# Patient Record
Sex: Female | Born: 2005 | Race: Black or African American | Hispanic: No | Marital: Single | Smoking: Never smoker
Health system: Southern US, Community
[De-identification: ages and names within clinical notes are randomized; demographics above are authoritative.]

## PROBLEM LIST (undated history)

## (undated) DIAGNOSIS — J45909 Unspecified asthma, uncomplicated: Secondary | ICD-10-CM

---
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
## 2024-01-04 LAB — CERVICOVAGINAL ANCILLARY ONLY
Bacterial Vaginitis (gardnerella): NEGATIVE
Candida Glabrata: NEGATIVE
Candida Vaginitis: NEGATIVE
Chlamydia: NEGATIVE
Comment: NEGATIVE
Comment: NEGATIVE
Comment: NEGATIVE
Comment: NEGATIVE
Comment: NEGATIVE
Comment: NORMAL
Neisseria Gonorrhea: NEGATIVE
Trichomonas: NEGATIVE

## 2024-01-11 ENCOUNTER — Other Ambulatory Visit: Payer: Self-pay | Admitting: Obstetrics & Gynecology

## 2024-03-17 ENCOUNTER — Encounter: Payer: Self-pay | Admitting: Obstetrics & Gynecology

## 2024-03-17 ENCOUNTER — Other Ambulatory Visit (HOSPITAL_COMMUNITY)
Admission: RE | Admit: 2024-03-17 | Discharge: 2024-03-17 | Disposition: A | Discharge status: Home / Self Care | Source: Ambulatory Visit | Attending: Obstetrics & Gynecology | Admitting: Obstetrics & Gynecology

## 2024-03-17 ENCOUNTER — Ambulatory Visit (INDEPENDENT_AMBULATORY_CARE_PROVIDER_SITE_OTHER): Admitting: Obstetrics & Gynecology

## 2024-03-17 VITALS — BP 100/67 | HR 77 | Wt 120.0 lb

## 2024-03-17 DIAGNOSIS — N920 Excessive and frequent menstruation with regular cycle: Secondary | ICD-10-CM | POA: Insufficient documentation

## 2024-03-17 DIAGNOSIS — N760 Acute vaginitis: Secondary | ICD-10-CM

## 2024-03-17 DIAGNOSIS — Z113 Encounter for screening for infections with a predominantly sexual mode of transmission: Secondary | ICD-10-CM | POA: Diagnosis not present

## 2024-03-17 DIAGNOSIS — Z30013 Encounter for initial prescription of injectable contraceptive: Secondary | ICD-10-CM | POA: Diagnosis not present

## 2024-03-17 MED ORDER — MEDROXYPROGESTERONE ACETATE 150 MG/ML IM SUSP: 150.0000 mg | INTRAMUSCULAR | 3 refills | Status: DC

## 2024-03-17 MED ORDER — MEDROXYPROGESTERONE ACETATE 150 MG/ML IM SUSY
150.0000 mg | PREFILLED_SYRINGE | Freq: Once | INTRAMUSCULAR | Status: AC
  Administered 2024-03-17: 150 mg via INTRAMUSCULAR

## 2024-03-17 NOTE — Progress Notes (Signed)
 GYNECOLOGY OFFICE VISIT NOTE  History:  Danielle Weber is a 18 y.o. G0P0000 here today for evaluation and management of heavy menstrual periods.  Here with her mother.  Reports menarche at 33, always had heavy periods, where she saturates a pad an hour and associated with cramping.  Was on pills in the past, but forgot to take them, then switched to Nexplanon which caused prolonged bleeding for over three months. Now on Xulane, this helps her bleeding but it falls off a lot and causes irregular bleeding.  Wants to discuss alternative.  Of note, patient had Chlamydia in 11/2023, also wants STI testing.  Reports having recurrent BV, wants evaluation for this.   She denies any current abnormal vaginal bleeding, pelvic pain or other concerns.  Past Medical History:  Diagnosis Date   Asthma    Chlamydia 11/21/2023    History reviewed. No pertinent surgical history.  The following portions of the patient's history were reviewed and updated as appropriate: allergies, current medications, past family history, past medical history, past social history, past surgical history and problem list.   Health Maintenance:  Has received HPV vaccine series  Review of Systems:  Pertinent items noted in HPI and remainder of comprehensive ROS otherwise negative.  Physical Exam:  BP 100/67   Pulse 77   Wt 120 lb (54.4 kg)   LMP 03/10/2024   BMI 21.26 kg/m  CONSTITUTIONAL: Well-developed, well-nourished female in no acute distress.  HEENT:  Normocephalic, atraumatic. External right and left ear normal. No scleral icterus.  NECK: Normal range of motion, supple, no masses noted on observation SKIN: No rash noted. Not diaphoretic. No erythema. No pallor. MUSCULOSKELETAL: Normal range of motion. No edema noted. NEUROLOGIC: Alert and oriented to person, place, and time. Normal muscle tone coordination. No cranial nerve deficit noted on observation. PSYCHIATRIC: Normal mood and affect. Normal behavior.  Normal judgment and thought content. CARDIOVASCULAR: Normal heart rate noted RESPIRATORY: Effort and breath sounds normal, no problems with respiration noted ABDOMEN: No masses or other overt distention noted on observation. No tenderness.   PELVIC: Normal appearing external genitalia; normal urethral meatus. Speculum exam deferred by patient.  No abnormal discharge noted, testing sample obtained.  Normal uterine size, no other palpable masses, no uterine or adnexal tenderness. Performed in the presence of a chaperone   Assessment and Plan:     1. Recurrent vaginitis Proper vulvar hygiene emphasized: discussed avoidance of perfumed soaps, detergents, lotions and any type of douches; in addition to wearing cotton underwear and no underwear at night.  Also recommended cleaning front to back, voiding and cleaning up after intercourse.  - Cervicovaginal ancillary only done, will follow up results and manage accordingly.  2. Routine screening for STI (sexually transmitted infection) STI screen done. Safe sex recommended. - RPR+HBsAg+HCVAb+HIV - Cervicovaginal ancillary only  3. Initiation of Depo Provera Discussed alternative birth control options, she wanted Depo Provera.  This was ordered for her. - medroxyPROGESTERone Acetate SUSY 150 mg - medroxyPROGESTERone (DEPO-PROVERA) 150 MG/ML injection; Inject 1 mL (150 mg total) into the muscle every 3 (three) months.  Dispense: 1 mL; Refill: 3  4. Menorrhagia with regular cycle (Primary) Patient has abnormal uterine bleeding . She has a normal exam, no evidence of lesions.  Will order abnormal uterine bleeding evaluation labs and pelvic ultrasound to evaluate for any structural gynecologic abnormalities.  Will contact patient with these results and plans for further evaluation.  Depo Provera initiated for contraception, this will help with her menorrhagia too.  -  Von Willebrand panel - Cervicovaginal ancillary only - CBC - Beta hCG quant (ref  lab) - TSH Rfx on Abnormal to Free T4 - US  PELVIC COMPLETE WITH TRANSVAGINAL; Future   Routine preventative health maintenance measures emphasized. Please refer to After Visit Summary for other counseling recommendations.   Return for any gynecologic concerns or follow up as recommended.    I spent 45 minutes dedicated to the care of this patient including pre-visit review of records, face to face time with the patient discussing her conditions and treatments, post visit ordering of medications and appropriate tests or procedures, coordinating care and documenting this visit encounter.    Lenoard Rad, MD, FACOG Obstetrician & Gynecologist, [facility] [employer], [facility]

## 2024-03-18 ENCOUNTER — Ambulatory Visit

## 2024-03-18 ENCOUNTER — Ambulatory Visit: Payer: Self-pay | Admitting: Obstetrics & Gynecology

## 2024-03-18 DIAGNOSIS — N920 Excessive and frequent menstruation with regular cycle: Secondary | ICD-10-CM

## 2024-03-18 DIAGNOSIS — D500 Iron deficiency anemia secondary to blood loss (chronic): Secondary | ICD-10-CM

## 2024-03-18 LAB — CERVICOVAGINAL ANCILLARY ONLY
Bacterial Vaginitis (gardnerella): NEGATIVE
Candida Glabrata: NEGATIVE
Candida Vaginitis: NEGATIVE
Chlamydia: NEGATIVE
Comment: NEGATIVE
Comment: NEGATIVE
Comment: NEGATIVE
Comment: NEGATIVE
Comment: NEGATIVE
Comment: NORMAL
Neisseria Gonorrhea: NEGATIVE
Trichomonas: NEGATIVE

## 2024-03-18 LAB — TSH RFX ON ABNORMAL TO FREE T4: TSH: 1.56 u[IU]/mL (ref 0.450–4.500)

## 2024-03-19 ENCOUNTER — Ambulatory Visit
Admission: RE | Admit: 2024-03-19 | Discharge: 2024-03-19 | Disposition: A | Discharge status: Home / Self Care | Source: Ambulatory Visit | Attending: Obstetrics & Gynecology | Admitting: Obstetrics & Gynecology

## 2024-03-19 DIAGNOSIS — N920 Excessive and frequent menstruation with regular cycle: Secondary | ICD-10-CM | POA: Diagnosis present

## 2024-03-20 LAB — RPR+HBSAG+HCVAB+...
HIV Screen 4th Generation wRfx: NONREACTIVE
Hep C Virus Ab: NONREACTIVE
Hepatitis B Surface Ag: NEGATIVE
RPR Ser Ql: NONREACTIVE

## 2024-03-20 LAB — VON WILLEBRAND PANEL
Factor VIII Activity: 126 % (ref 56–140)
Von Willebrand Ag: 142 % (ref 50–200)
Von Willebrand Factor: 141 % (ref 50–200)

## 2024-03-20 LAB — CBC
Hematocrit: 33.9 % — ABNORMAL LOW (ref 34.0–46.6)
Hemoglobin: 10.2 g/dL — ABNORMAL LOW (ref 11.1–15.9)
MCH: 24.5 pg — ABNORMAL LOW (ref 26.6–33.0)
MCHC: 30.1 g/dL — ABNORMAL LOW (ref 31.5–35.7)
MCV: 82 fL (ref 79–97)
Platelets: 363 10*3/uL (ref 150–450)
RBC: 4.16 x10E6/uL (ref 3.77–5.28)
RDW: 15.7 % — ABNORMAL HIGH (ref 11.7–15.4)
WBC: 6 10*3/uL (ref 3.4–10.8)

## 2024-03-20 LAB — BETA HCG QUANT (REF LAB): hCG Quant: <1 m[IU]/mL

## 2024-03-20 LAB — COAG STUDIES INTERP REPORT

## 2024-03-22 DIAGNOSIS — D500 Iron deficiency anemia secondary to blood loss (chronic): Secondary | ICD-10-CM | POA: Insufficient documentation

## 2024-03-22 DIAGNOSIS — N920 Excessive and frequent menstruation with regular cycle: Secondary | ICD-10-CM | POA: Insufficient documentation

## 2024-03-22 MED ORDER — FERRIC MALTOL 30 MG PO CAPS: 1.0000 | ORAL_CAPSULE | Freq: Two times a day (BID) | ORAL | 2 refills | Status: AC

## 2024-04-02 ENCOUNTER — Encounter: Payer: Self-pay | Admitting: Emergency Medicine

## 2024-04-02 ENCOUNTER — Ambulatory Visit
Admission: EM | Admit: 2024-04-02 | Discharge: 2024-04-02 | Disposition: A | Discharge status: Home / Self Care | Attending: Family Medicine | Admitting: Family Medicine

## 2024-04-02 DIAGNOSIS — J069 Acute upper respiratory infection, unspecified: Secondary | ICD-10-CM | POA: Insufficient documentation

## 2024-04-02 DIAGNOSIS — R350 Frequency of micturition: Secondary | ICD-10-CM | POA: Diagnosis present

## 2024-04-02 LAB — URINALYSIS, W/ REFLEX TO CULTURE (INFECTION SUSPECTED)
Bilirubin Urine: NEGATIVE
Glucose, UA: NEGATIVE mg/dL
Hgb urine dipstick: NEGATIVE
Ketones, ur: NEGATIVE mg/dL
Leukocytes,Ua: NEGATIVE
Nitrite: NEGATIVE
Protein, ur: 100 mg/dL — AB
RBC / HPF: NONE SEEN RBC/hpf (ref 0–5)
Specific Gravity, Urine: >1.03 — ABNORMAL HIGH (ref 1.005–1.030)
pH: 5.5 (ref 5.0–8.0)

## 2024-04-02 LAB — SARS CORONAVIRUS 2 BY RT PCR: SARS Coronavirus 2 by RT PCR: NEGATIVE

## 2024-04-02 LAB — PREGNANCY, URINE: Preg Test, Ur: NEGATIVE

## 2024-04-02 NOTE — ED Provider Notes (Signed)
 [facility]    CSN: [id number] Arrival date & time: 04/02/24  1611      History   Chief Complaint Chief Complaint  Patient presents with   Nasal Congestion   Urinary Frequency    HPI Danielle Weber is a 18 y.o. female.   HPI  History obtained from the patient. Danielle Weber presents for flu-like symptoms that started on Monday.  She went to a doctor and got a strep test.  Last night, her food started tasting "chemically." Had headache, sore throat, diarrhea, rhinorrhea, nasal congestion, chills and body aches. Denies fever, vomiting, nausea.  Someone told her that she may have COVID so she came in for COVID test.  Tried Tylenol  and Theraflu which helped somewhat.   Today, has abdominal cramping and urinary frequency.  Patient's last menstrual period was 03/14/2024 (approximate).  She does not use condoms regularly.  She is not concerned for an STD.  Denies vaginal discharge and dysuria.      Past Medical History:  Diagnosis Date   Asthma    Chlamydia 11/21/2023    Patient Active Problem List   Diagnosis Date Noted   Anemia due to blood loss, chronic 03/22/2024   Menorrhagia with regular cycle 03/22/2024    History reviewed. No pertinent surgical history.  OB History     Gravida  0   Para  0   Term  0   Preterm  0   AB  0   Living  0      SAB  0   IAB  0   Ectopic  0   Multiple  0   Live Births  0            Home Medications    Prior to Admission medications   Medication Sig Start Date End Date Taking? Authorizing Provider  medroxyPROGESTERone  (DEPO-PROVERA ) 150 MG/ML injection Inject 1 mL (150 mg total) into the muscle every 3 (three) months. 03/17/24  Yes Anyanwu, Ugonna A, MD  Ferric Maltol  30 MG CAPS Take 1 capsule (30 mg total) by mouth 2 (two) times daily. Please take one hour before breakfast and dinner 03/22/24   Anyanwu, Ugonna A, MD  ipratropium-albuterol  (DUONEB) 0.5-2.5 (3) MG/3ML SOLN Take 3 mLs by nebulization every  4 (four) hours as needed. Patient not taking: Reported on 03/17/2024 05/03/23   Roemhildt, Lorin T, PA-C  metroNIDAZOLE  (FLAGYL ) 500 MG tablet Take 1 tablet (500 mg total) by mouth 2 (two) times daily. Patient not taking: Reported on 03/17/2024 05/13/23   Kent Pear, NP  ondansetron  (ZOFRAN -ODT) 4 MG disintegrating tablet Take 1 tablet (4 mg total) by mouth every 8 (eight) hours as needed for nausea or vomiting. Patient not taking: Reported on 03/17/2024 01/05/23   Suellyn Emory, MD    Family History History reviewed. No pertinent family history.  Social History Social History   Tobacco Use   Smoking status: Never   Smokeless tobacco: Never  Vaping Use   Vaping status: Never Used  Substance Use Topics   Alcohol use: No   Drug use: No     Allergies   Patient has no known allergies.   Review of Systems Review of Systems: negative unless otherwise stated in HPI.      Physical Exam Triage Vital Signs ED Triage Vitals  Encounter Vitals Group     BP 04/02/24 1643 113/69     Systolic BP Percentile --      Diastolic BP Percentile --  Pulse Rate 04/02/24 1643 77     Resp 04/02/24 1643 16     Temp 04/02/24 1643 98.3 F (36.8 C)     Temp Source 04/02/24 1643 Oral     SpO2 04/02/24 1643 98 %     Weight 04/02/24 1641 119 lb 14.9 oz (54.4 kg)     Height 04/02/24 1641 5\' 3"  (1.6 m)     Head Circumference --      Peak Flow --      Pain Score 04/02/24 1641 8     Pain Loc --      Pain Education --      Exclude from Growth Chart --    No data found.  Updated Vital Signs BP 113/69 (BP Location: Left Arm)   Pulse 77   Temp 98.3 F (36.8 C) (Oral)   Resp 16   Ht 5\' 3"  (1.6 m)   Wt 54.4 kg   LMP 03/14/2024 (Approximate)   SpO2 98%   BMI 21.24 kg/m   Visual Acuity Right Eye Distance:   Left Eye Distance:   Bilateral Distance:    Right Eye Near:   Left Eye Near:    Bilateral Near:     Physical Exam GEN:     alert, non-toxic appearing female in no distress     HENT:  mucus membranes moist, oropharyngeal without lesions or erythema, no tonsillar hypertrophy or exudates, no nasal discharge EYES:   no scleral injection or discharge RESP:  no increased work of breathing, clear to auscultation bilaterally CVS:   regular rate and rhythm Skin:   warm and dry    UC Treatments / Results  Labs (all labs ordered are listed, but only abnormal results are displayed) Labs Reviewed  URINALYSIS, W/ REFLEX TO CULTURE (INFECTION SUSPECTED) - Abnormal; Notable for the following components:      Result Value   Specific Gravity, Urine >1.030 (*)    Protein, ur 100 (*)    Bacteria, UA FEW (*)    All other components within normal limits  SARS CORONAVIRUS 2 BY RT PCR  PREGNANCY, URINE    EKG   Radiology No results found.  Procedures Procedures (including critical care time)  Medications Ordered in UC Medications - No data to display  Initial Impression / Assessment and Plan / UC Course  I have reviewed the triage vital signs and the nursing notes.  Pertinent labs & imaging results that were available during my care of the patient were reviewed by me and considered in my medical decision making (see chart for details).       Pt is a 18 y.o. female who presents for 3 days of respiratory symptoms. Danielle Weber is afebrile here without recent antipyretics. Satting well on room air. Overall pt is non-toxic appearing, well hydrated, without respiratory distress. Pulmonary exam is unremarkable.  COVID obtained and was negative. History consistent with viral respiratory illness. Discussed symptomatic treatment.  Explained lack of efficacy of antibiotics in viral disease.  Typical duration of symptoms discussed.   Urine pregnancy test is negative.  Urinalysis not concerning for acute cystitis and there is no hematuria to suggest nephrolithiasis and no glucosuria to suggest early onset diabetes.  She does have some slight proteinuria.   Return and ED  precautions given and voiced understanding. Discussed MDM, treatment plan and plan for follow-up with patient who agrees with plan.     Final Clinical Impressions(s) / UC Diagnoses   Final diagnoses:  Viral URI with cough  Urinary frequency     Discharge Instructions      [name], your urine did not show evidence of a UTI and you are not pregnant.    Your COVID test was negative.  You have a viral respiratory infection that will gradually improve over the next 7-10 days. Cough may last up to 3 weeks.    You can take Tylenol  and/or Ibuprofen  as needed for fever reduction and pain relief.    For cough: honey 1/2 to 1 teaspoon (you can dilute the honey in water or another fluid). You can also use guaifenesin and dextromethorphan for cough. You can use a humidifier for chest congestion and cough.  If you don't have a humidifier, you can sit in the bathroom with the hot shower running.      For sore throat: try warm salt water gargles, Mucinex sore throat cough drops or cepacol lozenges, throat spray, warm tea or water with lemon/honey, popsicles or ice, or OTC cold relief medicine for throat discomfort. You can also purchase chloraseptic spray at the pharmacy or dollar store.   For congestion: take a daily anti-histamine like Zyrtec, Claritin, and a oral decongestant, such as pseudoephedrine.  You can also use Flonase 1-2 sprays in each nostril daily. Afrin is also a good option, if you do not have high blood pressure.    It is important to stay hydrated: drink plenty of fluids (water, gatorade/powerade/pedialyte, juices, or teas) to keep your throat moisturized and help further relieve irritation/discomfort.    Return or go to the Emergency Department if symptoms worsen or do not improve in the next few days      ED Prescriptions   None    PDMP not reviewed this encounter.   Sharnita Bogucki, DO 04/03/24 1549

## 2024-04-02 NOTE — ED Triage Notes (Signed)
 Pt c/o taste of her food being off, headache, nasal congestion. Started about 3 days ago. She states she was seen by her pcp and tested for strep and it was negative. Pt also c/o urinary frequency. Unsure if she has had fever.

## 2024-04-02 NOTE — Discharge Instructions (Addendum)
 Anysia Adolf, your urine did not show evidence of a UTI and you are not pregnant.    Your COVID test was negative.  You have a viral respiratory infection that will gradually improve over the next 7-10 days. Cough may last up to 3 weeks.    You can take Tylenol  and/or Ibuprofen  as needed for fever reduction and pain relief.    For cough: honey 1/2 to 1 teaspoon (you can dilute the honey in water or another fluid). You can also use guaifenesin and dextromethorphan for cough. You can use a humidifier for chest congestion and cough.  If you don't have a humidifier, you can sit in the bathroom with the hot shower running.      For sore throat: try warm salt water gargles, Mucinex sore throat cough drops or cepacol lozenges, throat spray, warm tea or water with lemon/honey, popsicles or ice, or OTC cold relief medicine for throat discomfort. You can also purchase chloraseptic spray at the pharmacy or dollar store.   For congestion: take a daily anti-histamine like Zyrtec, Claritin, and a oral decongestant, such as pseudoephedrine.  You can also use Flonase 1-2 sprays in each nostril daily. Afrin is also a good option, if you do not have high blood pressure.    It is important to stay hydrated: drink plenty of fluids (water, gatorade/powerade/pedialyte, juices, or teas) to keep your throat moisturized and help further relieve irritation/discomfort.    Return or go to the Emergency Department if symptoms worsen or do not improve in the next few days

## 2024-04-14 ENCOUNTER — Ambulatory Visit
Admission: EM | Admit: 2024-04-14 | Discharge: 2024-04-14 | Disposition: A | Discharge status: Home / Self Care | Attending: Physician Assistant | Admitting: Physician Assistant

## 2024-04-14 DIAGNOSIS — N898 Other specified noninflammatory disorders of vagina: Secondary | ICD-10-CM | POA: Insufficient documentation

## 2024-04-14 DIAGNOSIS — Z113 Encounter for screening for infections with a predominantly sexual mode of transmission: Secondary | ICD-10-CM | POA: Diagnosis not present

## 2024-04-14 DIAGNOSIS — N760 Acute vaginitis: Secondary | ICD-10-CM | POA: Insufficient documentation

## 2024-04-14 MED ORDER — METRONIDAZOLE 0.75 % VA GEL: 1.0000 | Freq: Every day | VAGINAL | 0 refills | Status: AC

## 2024-04-14 NOTE — Discharge Instructions (Signed)

## 2024-04-14 NOTE — ED Provider Notes (Signed)
 [facility]    CSN: [id number] Arrival date & time: 04/14/24  1403      History   Chief Complaint Chief Complaint  Patient presents with   vaginal odor    HPI Danielle Weber is a 18 y.o. female presenting for 3-day history of fishy smelling vaginal odor and discharge.  Denies fever, pelvic pain, abdominal pain, dysuria, frequency or urgency or concern for pregnancy.  No new sexual partners.  Patient believes she has BV.  HPI  Past Medical History:  Diagnosis Date   Asthma    Chlamydia 11/21/2023    Patient Active Problem List   Diagnosis Date Noted   Anemia due to blood loss, chronic 03/22/2024   Menorrhagia with regular cycle 03/22/2024    History reviewed. No pertinent surgical history.  OB History     Gravida  0   Para  0   Term  0   Preterm  0   AB  0   Living  0      SAB  0   IAB  0   Ectopic  0   Multiple  0   Live Births  0            Home Medications    Prior to Admission medications   Medication Sig Start Date End Date Taking? Authorizing Provider  metroNIDAZOLE  (METROGEL ) 0.75 % vaginal gel Place 1 Applicatorful vaginally at bedtime for 7 days. 04/14/24 04/21/24 Yes Floydene Hy, PA-C  Ferric Maltol  30 MG CAPS Take 1 capsule (30 mg total) by mouth 2 (two) times daily. Please take one hour before breakfast and dinner 03/22/24   Anyanwu, Ugonna A, MD  ipratropium-albuterol  (DUONEB) 0.5-2.5 (3) MG/3ML SOLN Take 3 mLs by nebulization every 4 (four) hours as needed. Patient not taking: Reported on 03/17/2024 05/03/23   Roemhildt, Lorin T, PA-C  medroxyPROGESTERone  (DEPO-PROVERA ) 150 MG/ML injection Inject 1 mL (150 mg total) into the muscle every 3 (three) months. 03/17/24   Julianne Octave, MD    Family History History reviewed. No pertinent family history.  Social History Social History   Tobacco Use   Smoking status: Never   Smokeless tobacco: Never  Vaping Use   Vaping status: Never Used  Substance Use  Topics   Alcohol use: No   Drug use: No     Allergies   Patient has no known allergies.   Review of Systems Review of Systems  Constitutional:  Negative for fatigue and fever.  Gastrointestinal:  Negative for abdominal pain.  Genitourinary:  Positive for vaginal discharge. Negative for dysuria, flank pain, frequency, hematuria, urgency, vaginal bleeding and vaginal pain.  Musculoskeletal:  Negative for back pain.  Skin:  Negative for rash.     Physical Exam Triage Vital Signs ED Triage Vitals  Encounter Vitals Group     BP      Systolic BP Percentile      Diastolic BP Percentile      Pulse      Resp      Temp      Temp src      SpO2      Weight      Height      Head Circumference      Peak Flow      Pain Score      Pain Loc      Pain Education      Exclude from Growth Chart    No data found.  Updated Vital  Signs BP 106/72 (BP Location: Right Arm)   Pulse 88   Temp 99.1 F (37.3 C) (Oral)   Resp 16   LMP 04/08/2024 (Exact Date)   SpO2 98%       Physical Exam Vitals and nursing note reviewed.  Constitutional:      General: She is not in acute distress.    Appearance: Normal appearance. She is not ill-appearing or toxic-appearing.  HENT:     Head: Normocephalic and atraumatic.  Eyes:     General: No scleral icterus.       Right eye: No discharge.        Left eye: No discharge.     Conjunctiva/sclera: Conjunctivae normal.  Cardiovascular:     Rate and Rhythm: Normal rate.  Pulmonary:     Effort: Pulmonary effort is normal. No respiratory distress.  Abdominal:     Palpations: Abdomen is soft.     Tenderness: There is no abdominal tenderness.  Musculoskeletal:     Cervical back: Neck supple.  Skin:    General: Skin is dry.  Neurological:     General: No focal deficit present.     Mental Status: She is alert. Mental status is at baseline.     Motor: No weakness.     Gait: Gait normal.  Psychiatric:        Mood and Affect: Mood normal.         Behavior: Behavior normal.      UC Treatments / Results  Labs (all labs ordered are listed, but only abnormal results are displayed) Labs Reviewed  CERVICOVAGINAL ANCILLARY ONLY    EKG   Radiology No results found.  Procedures Procedures (including critical care time)  Medications Ordered in UC Medications - No data to display  Initial Impression / Assessment and Plan / UC Course  I have reviewed the triage vital signs and the nursing notes.  Pertinent labs & imaging results that were available during my care of the patient were reviewed by me and considered in my medical decision making (see chart for details).   18 year old female presents for 3-day history of for she foul-smelling vaginal discharge.  Would also like STI screening.  Symptoms are consistent with BV per patient.  Patient elects to forego pelvic exam and perform vaginal self swab for BV/yeast/trichomonas/chlamydia and gonorrhea.  Acute vaginitis.  Will treat at this time for suspected BV with MetroGel .  Supportive care.  Will amend treatment based on swab results.   Final Clinical Impressions(s) / UC Diagnoses   Final diagnoses:  Acute vaginitis  Foul smelling vaginal discharge  Routine screening for STI (sexually transmitted infection)     Discharge Instructions      The most common types of vaginal infections are yeast infections and bacterial vaginosis. Neither of which are really considered to be sexually transmitted. Often a pH swab or wet prep is performed and if abnormal may reveal either type of infection. Begin metronidazole  if prescribed for possible BV infection. If there is concern for yeast infection, fluconazole  is often prescribed . Take this as directed. You may also apply topical miconazole (can be purchased OTC) externally for relief of itching. Increase rest and fluid intake. If labs sent out, we will call within 2-5 days with results and amend treatment if necessary. Always try to  use pH balanced washes/wipes, urinate after intercourse, stay hydrated, and take probiotics if you are prone to vaginal infections. Return or see PCP or gynecologist for new/worsening infections.  ED Prescriptions     Medication Sig Dispense Auth. Provider   metroNIDAZOLE  (METROGEL ) 0.75 % vaginal gel Place 1 Applicatorful vaginally at bedtime for 7 days. 70 g Floydene Hy, PA-C      PDMP not reviewed this encounter.   Floydene Hy, PA-C 04/14/24 1448

## 2024-04-14 NOTE — ED Triage Notes (Signed)
 Pt states she is having vaginal odor x 3 days. States no other symptoms.

## 2024-04-15 ENCOUNTER — Ambulatory Visit: Payer: Self-pay

## 2024-04-15 LAB — CERVICOVAGINAL ANCILLARY ONLY
Bacterial Vaginitis (gardnerella): POSITIVE — AB
Candida Glabrata: NEGATIVE
Candida Vaginitis: NEGATIVE
Chlamydia: POSITIVE — AB
Comment: NEGATIVE
Comment: NEGATIVE
Comment: NEGATIVE
Comment: NEGATIVE
Comment: NEGATIVE
Comment: NORMAL
Neisseria Gonorrhea: NEGATIVE
Trichomonas: NEGATIVE

## 2024-04-15 MED ORDER — DOXYCYCLINE HYCLATE 100 MG PO TABS: 100.0000 mg | ORAL_TABLET | Freq: Two times a day (BID) | ORAL | 0 refills | Status: AC

## 2024-04-19 ENCOUNTER — Ambulatory Visit

## 2024-04-19 ENCOUNTER — Telehealth: Payer: Self-pay | Admitting: Obstetrics & Gynecology

## 2024-04-19 NOTE — Telephone Encounter (Signed)
 attempted to reach pt regarding appt today voicemail was full and could not leave a message.

## 2024-05-07 ENCOUNTER — Ambulatory Visit (HOSPITAL_COMMUNITY): Payer: Self-pay

## 2024-05-10 ENCOUNTER — Other Ambulatory Visit (HOSPITAL_COMMUNITY)
Admission: RE | Admit: 2024-05-10 | Discharge: 2024-05-10 | Disposition: A | Discharge status: Home / Self Care | Source: Ambulatory Visit | Attending: Obstetrics & Gynecology | Admitting: Obstetrics & Gynecology

## 2024-05-10 ENCOUNTER — Ambulatory Visit

## 2024-05-10 DIAGNOSIS — N898 Other specified noninflammatory disorders of vagina: Secondary | ICD-10-CM | POA: Insufficient documentation

## 2024-05-10 DIAGNOSIS — Z113 Encounter for screening for infections with a predominantly sexual mode of transmission: Secondary | ICD-10-CM

## 2024-05-10 NOTE — Progress Notes (Signed)
 SUBJECTIVE:  18 y.o. female who desires a STI screen. Denies abnormal vaginal discharge, bleeding or significant pelvic pain. No UTI symptoms. Does note that she was tested and positive for BV and Chlamydia about 3 weeks ago, Pt is advised that she is to early for a TOC but stating that she is concerned that she has yeast or the BV has not subsided therefore Pt is requesting a repeat swab and understands that it may still be positive for Chlamydia.  Patient's last menstrual period was 04/08/2024 (exact date).  OBJECTIVE:  She appears well.   ASSESSMENT:  STI Screen   PLAN:  Pt offered STI blood screening-not indicated GC, chlamydia, and trichomonas probe sent to lab.  Treatment: To be determined once lab results are received.  Pt follow up as needed.

## 2024-05-11 LAB — CERVICOVAGINAL ANCILLARY ONLY
Bacterial Vaginitis (gardnerella): POSITIVE — AB
Candida Glabrata: NEGATIVE
Candida Vaginitis: NEGATIVE
Chlamydia: POSITIVE — AB
Comment: NEGATIVE
Comment: NEGATIVE
Comment: NEGATIVE
Comment: NEGATIVE
Comment: NEGATIVE
Comment: NORMAL
Neisseria Gonorrhea: NEGATIVE
Trichomonas: NEGATIVE

## 2024-05-12 ENCOUNTER — Ambulatory Visit: Payer: Self-pay | Admitting: Obstetrics & Gynecology

## 2024-05-12 DIAGNOSIS — B9689 Other specified bacterial agents as the cause of diseases classified elsewhere: Secondary | ICD-10-CM

## 2024-05-12 MED ORDER — METRONIDAZOLE 500 MG PO TABS: 500.0000 mg | ORAL_TABLET | Freq: Two times a day (BID) | ORAL | 0 refills | Status: AC

## 2024-05-12 NOTE — Progress Notes (Signed)
 Vaginal discharge test is abnormal and showed bacterial vaginitis. Metronidazole  prescribed.  Patient was informed via MyChart, advised to pick up prescribed medication and take as directed.   Chlamydia is still positive, but this is still within window of previous test period/treatment.  Recheck in about 2-3 weeks. If still positive then, will need repeat treatment.  However, if she feels she has been re-exposed after initial treatment, we can err on the side of caution and prescribe another course of Doxycyline.   Please inform patient of results, advise to pick up prescribed medication and take as directed.   Results were released to MyChart and patient was given recommendations as indicated.   Gloris Hugger, MD

## 2024-06-08 DIAGNOSIS — Z789 Other specified health status: Secondary | ICD-10-CM

## 2024-06-08 MED ORDER — NORELGESTROMIN-ETH ESTRADIOL 150-35 MCG/24HR TD PTWK: 1.0000 | MEDICATED_PATCH | TRANSDERMAL | 12 refills | Status: AC

## 2024-06-10 ENCOUNTER — Encounter: Payer: Self-pay | Admitting: *Deleted

## 2024-06-10 ENCOUNTER — Ambulatory Visit
Admission: EM | Admit: 2024-06-10 | Discharge: 2024-06-10 | Disposition: A | Discharge status: Home / Self Care | Attending: Emergency Medicine | Admitting: Emergency Medicine

## 2024-06-10 ENCOUNTER — Ambulatory Visit

## 2024-06-10 DIAGNOSIS — Z9189 Other specified personal risk factors, not elsewhere classified: Secondary | ICD-10-CM | POA: Diagnosis present

## 2024-06-10 NOTE — ED Provider Notes (Signed)
 [facility]    CSN: [id number] Arrival date & time: 06/10/24  1611      History   Chief Complaint Chief Complaint  Patient presents with   Exposure to STD    HPI Danielle Weber is a 18 y.o. female.   18 year old, Danielle Weber, presents to urgent care for STI and BV testing.  Patient states she was tested a couple months ago at her PCP and tested positive for chlamydia and BV,  completed treatment but feels weird down there patient denies any symptoms, but had unprotected sex with new partner as she is going away to college this weekend.     The history is provided by the patient. No language interpreter was used.    Past Medical History:  Diagnosis Date   Asthma    Chlamydia 11/21/2023    Patient Active Problem List   Diagnosis Date Noted   At risk for sexually transmitted infection due to unprotected sex 06/10/2024   Anemia due to blood loss, chronic 03/22/2024   Menorrhagia with regular cycle 03/22/2024    History reviewed. No pertinent surgical history.  OB History     Gravida  0   Para  0   Term  0   Preterm  0   AB  0   Living  0      SAB  0   IAB  0   Ectopic  0   Multiple  0   Live Births  0            Home Medications    Prior to Admission medications   Medication Sig Start Date End Date Taking? Authorizing Provider  Ferric Maltol  30 MG CAPS Take 1 capsule (30 mg total) by mouth 2 (two) times daily. Please take one hour before breakfast and dinner 03/22/24   Anyanwu, Ugonna A, MD  ipratropium-albuterol  (DUONEB) 0.5-2.5 (3) MG/3ML SOLN Take 3 mLs by nebulization every 4 (four) hours as needed. Patient not taking: Reported on 03/17/2024 05/03/23   Roemhildt, Lorin T, PA-C  norelgestromin -ethinyl estradiol  (XULANE) 150-35 MCG/24HR transdermal patch Place 1 patch onto the skin once a week. 06/08/24   Herchel Gloris LABOR, MD    Family History History reviewed. No pertinent family history.  Social History Social  History   Tobacco Use   Smoking status: Never   Smokeless tobacco: Never  Vaping Use   Vaping status: Never Used  Substance Use Topics   Alcohol use: No   Drug use: No     Allergies   Patient has no known allergies.   Review of Systems Review of Systems  Constitutional:  Negative for fever.  Gastrointestinal:  Negative for abdominal pain, nausea and vomiting.  Genitourinary:  Positive for vaginal pain. Negative for difficulty urinating, dysuria, frequency, genital sores and hematuria.  All other systems reviewed and are negative.    Physical Exam Triage Vital Signs ED Triage Vitals  Encounter Vitals Group     BP 06/10/24 1632 113/72     Girls Systolic BP Percentile --      Girls Diastolic BP Percentile --      Boys Systolic BP Percentile --      Boys Diastolic BP Percentile --      Pulse Rate 06/10/24 1632 88     Resp 06/10/24 1632 16     Temp 06/10/24 1632 98.4 F (36.9 C)     Temp Source 06/10/24 1632 Oral     SpO2 06/10/24 1632 100 %  Weight 06/10/24 1629 109 lb (49.4 kg)     Height 06/10/24 1629 5' 3 (1.6 m)     Head Circumference --      Peak Flow --      Pain Score 06/10/24 1629 0     Pain Loc --      Pain Education --      Exclude from Growth Chart --    No data found.  Updated Vital Signs BP 113/72 (BP Location: Left Arm)   Pulse 88   Temp 98.4 F (36.9 C) (Oral)   Resp 16   Ht 5' 3 (1.6 m)   Wt 109 lb (49.4 kg)   SpO2 100%   BMI 19.31 kg/m   Visual Acuity Right Eye Distance:   Left Eye Distance:   Bilateral Distance:    Right Eye Near:   Left Eye Near:    Bilateral Near:     Physical Exam Vitals and nursing note reviewed.  Genitourinary:    Comments: Deferred pt self swabbed Neurological:     General: No focal deficit present.     Mental Status: She is alert and oriented to person, place, and time.     GCS: GCS eye subscore is 4. GCS verbal subscore is 5. GCS motor subscore is 6.      UC Treatments / Results   Labs (all labs ordered are listed, but only abnormal results are displayed) Labs Reviewed  CERVICOVAGINAL ANCILLARY ONLY    EKG   Radiology No results found.  Procedures Procedures (including critical care time)  Medications Ordered in UC Medications - No data to display  Initial Impression / Assessment and Plan / UC Course  I have reviewed the triage vital signs and the nursing notes.  Pertinent labs & imaging results that were available during my care of the patient were reviewed by me and considered in my medical decision making (see chart for details).    Discussed exam findings and plan of care with patient, will check my chart for results, strict go to ER precautions given.   Patient verbalized understanding to this provider.  Ddx: Routine STI testing, STI, BV,yeast Final Clinical Impressions(s) / UC Diagnoses   Final diagnoses:  At risk for sexually transmitted infection due to unprotected sex     Discharge Instructions      Check my chart for results. Avoid sexual activity until results,treatment known and completed. Safe sex with all future sexual activity. We have sent testing for sexually transmitted infections. We will notify you of any positive results once they are received. If required, we will prescribe any medications you might need. You will not be notified of negative results.  Follow up with PCP. Return as needed.    ED Prescriptions   None    PDMP not reviewed this encounter.   Aminta Loose, NP 06/10/24 2036

## 2024-06-10 NOTE — Discharge Instructions (Signed)
 Check my chart for results. Avoid sexual activity until results,treatment known and completed. Safe sex with all future sexual activity. We have sent testing for sexually transmitted infections. We will notify you of any positive results once they are received. If required, we will prescribe any medications you might need. You will not be notified of negative results.  Follow up with PCP. Return as needed.

## 2024-06-10 NOTE — ED Triage Notes (Signed)
 Patient states she was tested for STIs at her PCP about 2 months ago and + for chlamydia and BV treated but has continued to have burning intermittently outside of urination and just feels weird down there.  Patient had sex with a new partner a few days ago.  She states PCP was going to re-test her later this month but she leaves for school on Sat and requests testing today.  No abdominal pain and no concern for pregnancy.

## 2024-06-11 ENCOUNTER — Ambulatory Visit (HOSPITAL_COMMUNITY): Payer: Self-pay

## 2024-06-11 LAB — CERVICOVAGINAL ANCILLARY ONLY
Bacterial Vaginitis (gardnerella): POSITIVE — AB
Chlamydia: POSITIVE — AB
Comment: NEGATIVE
Comment: NEGATIVE
Comment: NEGATIVE
Comment: NORMAL
Neisseria Gonorrhea: NEGATIVE
Trichomonas: NEGATIVE

## 2024-06-11 MED ORDER — DOXYCYCLINE HYCLATE 100 MG PO TABS: 100.0000 mg | ORAL_TABLET | Freq: Two times a day (BID) | ORAL | 0 refills | Status: AC

## 2024-06-11 MED ORDER — METRONIDAZOLE 0.75 % VA GEL: 1.0000 | Freq: Every day | VAGINAL | 0 refills | Status: AC

## 2024-11-11 ENCOUNTER — Ambulatory Visit
Admission: EM | Admit: 2024-11-11 | Discharge: 2024-11-11 | Disposition: A | Discharge status: Home / Self Care | Attending: Emergency Medicine | Admitting: Emergency Medicine

## 2024-11-11 DIAGNOSIS — Z9189 Other specified personal risk factors, not elsewhere classified: Secondary | ICD-10-CM | POA: Insufficient documentation

## 2024-11-11 DIAGNOSIS — Z3202 Encounter for pregnancy test, result negative: Secondary | ICD-10-CM | POA: Insufficient documentation

## 2024-11-11 LAB — POCT URINE PREGNANCY: Preg Test, Ur: NEGATIVE

## 2024-11-11 NOTE — Discharge Instructions (Addendum)
 Your pregnancy test is negative. You declined HIV and syphilis tested Check my chart for results.  Avoid all sexual activity until results,treatment known and completed for you and partner(s).  Safe sex with all future sexual activity.  We have sent testing for sexually transmitted infections. We will notify you of any positive results once they are received.  If required, we will prescribe any medications you might need. We will not notify you of negative results.   Follow up with PCP.  If you have new or worsening symptoms(abdominal pain,fever,etc, )go to ER for further evaluation.

## 2024-11-11 NOTE — ED Provider Notes (Signed)
 " [facility]    CSN: [id number] Arrival date & time: 11/11/24  1927      History   Chief Complaint Chief Complaint  Patient presents with   Exposure to STD    HPI Danielle Weber is a 19 y.o. female.   19 year old female, Danielle Weber presents to urgent care for STD check, patient states she is having discharge and patient recently tested positive for chlamydia.  Patient states she was given 1 pill, prior to that received a weeks worth of medication.  Patient states she slept with previous partner last week who had not been treated, but told her he was getting treated, pt unsure.  Patient declines HIV and syphilis testing.  Patient requesting urine pregnancy, last menstrual period was 10/19/2024, patient is supposed to be on birth control patch but has not been taking it per her report.  The history is provided by the patient. No language interpreter was used.    Past Medical History:  Diagnosis Date   Asthma    Chlamydia 11/21/2023    Patient Active Problem List   Diagnosis Date Noted   Urine pregnancy test negative 11/11/2024   At risk for sexually transmitted infection due to unprotected sex 06/10/2024   Anemia due to blood loss, chronic 03/22/2024   Menorrhagia with regular cycle 03/22/2024    History reviewed. No pertinent surgical history.  OB History     Gravida  0   Para  0   Term  0   Preterm  0   AB  0   Living  0      SAB  0   IAB  0   Ectopic  0   Multiple  0   Live Births  0            Home Medications    Prior to Admission medications  Medication Sig Start Date End Date Taking? Authorizing Provider  Ferric Maltol  30 MG CAPS Take 1 capsule (30 mg total) by mouth 2 (two) times daily. Please take one hour before breakfast and dinner 03/22/24   Anyanwu, Ugonna A, MD  ipratropium-albuterol  (DUONEB) 0.5-2.5 (3) MG/3ML SOLN Take 3 mLs by nebulization every 4 (four) hours as needed. Patient not taking: Reported on  03/17/2024 05/03/23   Roemhildt, Lorin T, PA-C  norelgestromin -ethinyl estradiol  (XULANE) 150-35 MCG/24HR transdermal patch Place 1 patch onto the skin once a week. 06/08/24   Herchel Gloris LABOR, MD    Family History History reviewed. No pertinent family history.  Social History Social History[1]   Allergies   Patient has no known allergies.   Review of Systems Review of Systems  Constitutional:  Negative for fever.  Gastrointestinal:  Negative for abdominal pain, nausea and vomiting.  Genitourinary:  Positive for vaginal discharge. Negative for dysuria and genital sores.  All other systems reviewed and are negative.    Physical Exam Triage Vital Signs ED Triage Vitals  Encounter Vitals Group     BP      Girls Systolic BP Percentile      Girls Diastolic BP Percentile      Boys Systolic BP Percentile      Boys Diastolic BP Percentile      Pulse      Resp      Temp      Temp src      SpO2      Weight      Height      Head Circumference  Peak Flow      Pain Score      Pain Loc      Pain Education      Exclude from Growth Chart    No data found.  Updated Vital Signs BP 109/74 (BP Location: Right Arm)   Pulse 85   Temp 98 F (36.7 C) (Oral)   Resp 18   Wt 114 lb 3.2 oz (51.8 kg)   LMP 10/19/2024   SpO2 98%   BMI 20.23 kg/m   Visual Acuity Right Eye Distance:   Left Eye Distance:   Bilateral Distance:    Right Eye Near:   Left Eye Near:    Bilateral Near:     Physical Exam Vitals and nursing note reviewed.  Constitutional:      General: She is not in acute distress.    Appearance: She is well-developed.  HENT:     Head: Normocephalic and atraumatic.  Eyes:     Conjunctiva/sclera: Conjunctivae normal.  Cardiovascular:     Rate and Rhythm: Normal rate.     Heart sounds: No murmur heard. Pulmonary:     Effort: Pulmonary effort is normal. No respiratory distress.  Abdominal:     Comments: Deferred,pt self swabbed  Musculoskeletal:         General: No swelling.     Cervical back: Neck supple.  Skin:    General: Skin is warm and dry.     Capillary Refill: Capillary refill takes less than 2 seconds.  Neurological:     General: No focal deficit present.     Mental Status: She is alert and oriented to person, place, and time.     GCS: GCS eye subscore is 4. GCS verbal subscore is 5. GCS motor subscore is 6.  Psychiatric:        Attention and Perception: Attention normal.        Mood and Affect: Mood normal.        Speech: Speech normal.        Behavior: Behavior normal. Behavior is cooperative.      UC Treatments / Results  Labs (all labs ordered are listed, but only abnormal results are displayed) Labs Reviewed  POCT URINE PREGNANCY - Normal  CERVICOVAGINAL ANCILLARY ONLY    EKG   Radiology No results found.  Procedures Procedures (including critical care time)  Medications Ordered in UC Medications - No data to display  Initial Impression / Assessment and Plan / UC Course  I have reviewed the triage vital signs and the nursing notes.  Pertinent labs & imaging results that were available during my care of the patient were reviewed by me and considered in my medical decision making (see chart for details).    Discussed exam findings and plan of care with patient : your pregnancy test is negative. You declined HIV and syphilis tested Check my chart for results.  Avoid all sexual activity until results,treatment known and completed for you and partner(s).  Safe sex with all future sexual activity.  We have sent testing for sexually transmitted infections. We will notify you of any positive results once they are received.  If required, we will prescribe any medications you might need. We will not notify you of negative results. Follow up with PCP.  If you have new or worsening symptoms(abdominal pain,fever,etc, )go to ER for further evaluation.  Patient verbalized understanding to this provider.  Ddx: STI  screening, at risk for sexually transmitted disease due to unprotected sex, urine  pregnancy test negative, BV, yeast Final Clinical Impressions(s) / UC Diagnoses   Final diagnoses:  At risk for sexually transmitted infection due to unprotected sex  Urine pregnancy test negative     Discharge Instructions      Your pregnancy test is negative. You declined HIV and syphilis tested Check my chart for results.  Avoid all sexual activity until results,treatment known and completed for you and partner(s).  Safe sex with all future sexual activity.  We have sent testing for sexually transmitted infections. We will notify you of any positive results once they are received.  If required, we will prescribe any medications you might need. We will not notify you of negative results.   Follow up with PCP.  If you have new or worsening symptoms(abdominal pain,fever,etc, )go to ER for further evaluation.     ED Prescriptions   None    PDMP not reviewed this encounter.     [1]  Social History Tobacco Use   Smoking status: Never   Smokeless tobacco: Never  Vaping Use   Vaping status: Never Used  Substance Use Topics   Alcohol use: No   Drug use: No     Melodye Swor, Rilla, NP 11/11/24 2024  "

## 2024-11-11 NOTE — ED Triage Notes (Signed)
 Pt present exposure to std, pt states having discharge. Pt recently tested positive for Chlamydia

## 2024-11-15 ENCOUNTER — Ambulatory Visit (HOSPITAL_COMMUNITY): Payer: Self-pay

## 2024-11-15 LAB — CERVICOVAGINAL ANCILLARY ONLY
Bacterial Vaginitis (gardnerella): NEGATIVE
Candida Glabrata: NEGATIVE
Candida Vaginitis: POSITIVE — AB
Chlamydia: POSITIVE — AB
Comment: NEGATIVE
Comment: NEGATIVE
Comment: NEGATIVE
Comment: NEGATIVE
Comment: NEGATIVE
Comment: NORMAL
Neisseria Gonorrhea: NEGATIVE
Trichomonas: NEGATIVE

## 2024-11-15 MED ORDER — FLUCONAZOLE 150 MG PO TABS: 150.0000 mg | ORAL_TABLET | Freq: Once | ORAL | 0 refills | Status: AC

## 2024-11-15 MED ORDER — DOXYCYCLINE HYCLATE 100 MG PO TABS: 100.0000 mg | ORAL_TABLET | Freq: Two times a day (BID) | ORAL | 0 refills | Status: AC
